# Patient Record
Sex: Female | Born: 1935 | Race: White | Hispanic: No
Health system: Southern US, Community
[De-identification: ages and names within clinical notes are randomized; demographics above are authoritative.]

## PROBLEM LIST (undated history)

## (undated) DIAGNOSIS — M1990 Unspecified osteoarthritis, unspecified site: Secondary | ICD-10-CM

## (undated) HISTORY — PX: HERNIA REPAIR: SHX51

---

## 2021-09-19 DEATH — deceased

## 2022-05-22 ENCOUNTER — Emergency Department (HOSPITAL_COMMUNITY): Payer: Medicare Other

## 2022-05-22 ENCOUNTER — Encounter (HOSPITAL_COMMUNITY): Payer: Self-pay

## 2022-05-22 ENCOUNTER — Other Ambulatory Visit: Payer: Self-pay

## 2022-05-22 ENCOUNTER — Emergency Department (HOSPITAL_COMMUNITY)
Admission: EM | Admit: 2022-05-22 | Discharge: 2022-05-22 | Disposition: A | Discharge status: Home / Self Care | Payer: Medicare Other | Attending: Emergency Medicine | Admitting: Emergency Medicine

## 2022-05-22 DIAGNOSIS — Z79899 Other long term (current) drug therapy: Secondary | ICD-10-CM | POA: Diagnosis not present

## 2022-05-22 DIAGNOSIS — I10 Essential (primary) hypertension: Secondary | ICD-10-CM | POA: Diagnosis not present

## 2022-05-22 DIAGNOSIS — Z7982 Long term (current) use of aspirin: Secondary | ICD-10-CM | POA: Insufficient documentation

## 2022-05-22 DIAGNOSIS — R0789 Other chest pain: Secondary | ICD-10-CM | POA: Diagnosis present

## 2022-05-22 HISTORY — DX: Unspecified osteoarthritis, unspecified site: M19.90

## 2022-05-22 LAB — BASIC METABOLIC PANEL
Anion gap: 9 (ref 5–15)
BUN: 59 mg/dL — ABNORMAL HIGH (ref 8–23)
CO2: 29 mmol/L (ref 22–32)
Calcium: 9.2 mg/dL (ref 8.9–10.3)
Chloride: 100 mmol/L (ref 98–111)
Creatinine, Ser: 1.23 mg/dL — ABNORMAL HIGH (ref 0.44–1.00)
GFR, Estimated: 43 mL/min — ABNORMAL LOW (ref >60)
Glucose, Bld: 95 mg/dL (ref 70–99)
Potassium: 4.6 mmol/L (ref 3.5–5.1)
Sodium: 138 mmol/L (ref 135–145)

## 2022-05-22 LAB — CBC
HCT: 32.9 % — ABNORMAL LOW (ref 36.0–46.0)
Hemoglobin: 10.2 g/dL — ABNORMAL LOW (ref 12.0–15.0)
MCH: 23.2 pg — ABNORMAL LOW (ref 26.0–34.0)
MCHC: 31 g/dL (ref 30.0–36.0)
MCV: 74.9 fL — ABNORMAL LOW (ref 80.0–100.0)
Platelets: 243 10*3/uL (ref 150–400)
RBC: 4.39 MIL/uL (ref 3.87–5.11)
RDW: 16.1 % — ABNORMAL HIGH (ref 11.5–15.5)
WBC: 8.3 10*3/uL (ref 4.0–10.5)
nRBC: 0 % (ref 0.0–0.2)

## 2022-05-22 LAB — TROPONIN I (HIGH SENSITIVITY): Troponin I (High Sensitivity): 10 ng/L (ref <18)

## 2022-05-22 MED ORDER — ACETAMINOPHEN 325 MG PO TABS
650.0000 mg | ORAL_TABLET | Freq: Once | ORAL | Status: AC
  Administered 2022-05-22: 650 mg via ORAL
  Filled 2022-05-22: qty 2

## 2022-05-22 MED ORDER — IBUPROFEN 800 MG PO TABS: ORAL_TABLET | ORAL | 0 refills | Status: AC

## 2022-05-22 NOTE — Discharge Instructions (Addendum)
Follow-up with your family doctor in 1 to 2 weeks if not improving 

## 2022-05-22 NOTE — ED Triage Notes (Signed)
Pt BIBA from [facility]. Pt c/o chest pain beginning yesterday. Chest pain increases with movement.   Pt also c/o pain in L knee- known problem- in process  Aox4  HR: 64 BP: 116/50 SPO2: 98 RA

## 2022-05-22 NOTE — ED Notes (Signed)
[facility] contacted. Pt on transpo list. ?

## 2022-05-22 NOTE — ED Provider Notes (Signed)
[facility] DEPT Provider Note   CSN: [id number] Arrival date & time: 05/22/22  1234     History  Chief Complaint  Patient presents with   Chest Pain    Ashley Maldonado is a 88 y.o. female.  Patient complains of chest discomfort when she leans forward.  She states that it started hurting when she tried to get off the commode.  Patient has a history of  The history is provided by the patient and medical records. No language interpreter was used.  Chest Pain Pain location:  L chest Pain quality: aching   Pain radiates to:  Does not radiate Pain severity:  Moderate Onset quality:  Sudden Timing:  Intermittent Progression:  Waxing and waning Chronicity:  New Context: not breathing   Relieved by:  Nothing Exacerbated by: Movement. Associated symptoms: no abdominal pain, no back pain, no cough, no fatigue and no headache       Home Medications Prior to Admission medications   Medication Sig Start Date End Date Taking? Authorizing Provider  acetaminophen (TYLENOL) 325 MG tablet Take 650 mg by mouth every 8 (eight) hours as needed (pain).   Yes [provider]  aspirin EC 81 MG tablet Take 81 mg by mouth daily. Swallow whole.   Yes [provider]  benazepril (LOTENSIN) 40 MG tablet Take 40 mg by mouth daily. 05/17/22  Yes [provider]  calcium carbonate (TUMS EX) 750 MG chewable tablet Chew 750 mg by mouth every 8 (eight) hours as needed (acid reflux).   Yes [provider]  Carboxymethylcellulose Sodium (REFRESH TEARS OP) Place 1 drop into both eyes every 8 (eight) hours as needed (dry eyes).   Yes [provider]  cetaphil (CETAPHIL) lotion Apply 1 application. topically daily.   Yes [provider]  diclofenac Sodium (VOLTAREN) 1 % GEL Apply 1 application. topically 3 (three) times daily. 05/12/22  Yes [provider]  FERROUS SULFATE PO Take 1 tablet by mouth daily. Iron High-Potency    Yes [provider]  fluticasone (FLONASE) 50 MCG/ACT nasal spray Place 1 spray into both nostrils 2 (two) times daily. 05/06/22  Yes [provider]  furosemide (LASIX) 20 MG tablet Take 20-40 mg by mouth See admin instructions. 40 mg at 09:00 20 mg at 13:00 05/17/22  Yes [provider]  hydrocortisone cream (PREPARATION H) 1 % Apply 1 application. topically daily as needed (Hemorrhoids).   Yes [provider]  ibuprofen (ADVIL) 800 MG tablet Take 1 every 8 hours for pain that is not relieved by Tylenol alone 05/22/22  Yes Milton Ferguson, MD  labetalol (NORMODYNE) 300 MG tablet Take 300 mg by mouth 2 (two) times daily. 05/17/22  Yes [provider]  levothyroxine (SYNTHROID) 88 MCG tablet Take 88 mcg by mouth daily. 05/17/22  Yes [provider]  loperamide (IMODIUM) 1 MG/5ML solution Take 3 mg by mouth daily as needed for diarrhea or loose stools.   Yes [provider]  loratadine (CLARITIN) 10 MG tablet Take 10 mg by mouth daily.   Yes [provider]  multivitamin-iron-minerals-folic acid (THERAPEUTIC-M) TABS tablet Take 1 tablet by mouth daily.   Yes [provider]  neomycin-bacitracin-polymyxin (NEOSPORIN) OINT Apply 1 application. topically daily as needed (nose bleeds).   Yes [provider]  nystatin powder Apply 1 application. topically every 12 (twelve) hours as needed (yeast, irritation, redness).   Yes [provider]  OVER THE COUNTER MEDICATION Take 600 mg by  mouth every 12 (twelve) hours as needed (cough, congestion).   Yes [provider]  predniSONE (DELTASONE) 10 MG tablet Take 10 mg by mouth at bedtime.   Yes [provider]  Simethicone 80 MG TABS Take 80 mg by mouth every 8 (eight) hours as needed (gas).   Yes [provider]  simvastatin (ZOCOR) 40 MG tablet Take 40 mg by mouth every evening. 05/17/22  Yes [provider]  tiZANidine (ZANAFLEX) 2 MG  tablet Take 2 mg by mouth every 8 (eight) hours as needed for muscle spasms.   Yes [provider]  traMADol (ULTRAM) 50 MG tablet Take 50 mg by mouth every 8 (eight) hours as needed for pain. 05/05/22  Yes [provider]  triamterene-hydrochlorothiazide (MAXZIDE) 75-50 MG tablet Take 1 tablet by mouth daily. 05/17/22  Yes [provider]      Allergies    Patient has no known allergies.    Review of Systems   Review of Systems  Constitutional:  Negative for appetite change and fatigue.  HENT:  Negative for congestion, ear discharge and sinus pressure.   Eyes:  Negative for discharge.  Respiratory:  Negative for cough.   Cardiovascular:  Positive for chest pain.  Gastrointestinal:  Negative for abdominal pain and diarrhea.  Genitourinary:  Negative for frequency and hematuria.  Musculoskeletal:  Negative for back pain.  Skin:  Negative for rash.  Neurological:  Negative for seizures and headaches.  Psychiatric/Behavioral:  Negative for hallucinations.    Physical Exam Updated Vital Signs BP (!) 138/58   Pulse 64   Temp 98.6 F (37 C) (Oral)   Resp 17   Ht 5\' 5"  (1.651 m)   Wt 117.9 kg   SpO2 97%   BMI 43.27 kg/m  Physical Exam Vitals and nursing note reviewed.  Constitutional:      Appearance: She is well-developed.  HENT:     Head: Normocephalic.     Nose: Nose normal.  Eyes:     General: No scleral icterus.    Conjunctiva/sclera: Conjunctivae normal.  Neck:     Thyroid: No thyromegaly.  Cardiovascular:     Rate and Rhythm: Normal rate and regular rhythm.     Heart sounds: No murmur heard.   No friction rub. No gallop.  Pulmonary:     Breath sounds: No stridor. No wheezing or rales.  Chest:     Chest wall: Tenderness present.  Abdominal:     General: There is no distension.     Tenderness: There is no abdominal tenderness. There is no rebound.  Musculoskeletal:        General: Normal range of motion.     Cervical back: Neck supple.   Lymphadenopathy:     Cervical: No cervical adenopathy.  Skin:    Findings: No erythema or rash.  Neurological:     Mental Status: She is alert and oriented to person, place, and time.     Motor: No abnormal muscle tone.     Coordination: Coordination normal.  Psychiatric:        Behavior: Behavior normal.    ED Results / Procedures / Treatments   Labs (all labs ordered are listed, but only abnormal results are displayed) Labs Reviewed  BASIC METABOLIC PANEL - Abnormal; Notable for the following components:      Result Value   BUN 59 (*)    Creatinine, Ser 1.23 (*)    GFR, Estimated 43 (*)    All other components within  normal limits  CBC - Abnormal; Notable for the following components:   Hemoglobin 10.2 (*)    HCT 32.9 (*)    MCV 74.9 (*)    MCH 23.2 (*)    RDW 16.1 (*)    All other components within normal limits  TROPONIN I (HIGH SENSITIVITY)  TROPONIN I (HIGH SENSITIVITY)    EKG EKG Interpretation  Date/Time:  Saturday May 22 2022 12:54:03 EDT Ventricular Rate:  62 PR Interval:  192 QRS Duration: 104 QT Interval:  408 QTC Calculation: 415 R Axis:   5 Text Interpretation: Sinus rhythm Inferior infarct, old Confirmed by Bethann BerkshireZammit, Lyden Redner [phone]([id number]) on 05/22/2022 2:46:56 PM  Radiology DG Chest 2 View  Result Date: 05/22/2022 CLINICAL DATA:  Chest pain beginning yesterday. EXAM: CHEST - 2 VIEW COMPARISON:  06/04/2021 FINDINGS: Stable mild cardiomegaly. Mild left basilar scarring is also stable. No evidence of acute infiltrate or edema. No evidence of pleural effusion. Anterior dislocations of both shoulders incidentally noted. IMPRESSION: Mild cardiomegaly and left basilar scarring. No active lung disease. Bilateral anterior shoulder dislocations. Electronically Signed   By: Danae OrleansJohn A Stahl M.D.   On: 05/22/2022 13:43    Procedures Procedures    Medications Ordered in ED Medications  acetaminophen (TYLENOL) tablet 650 mg (650 mg Oral Given 05/22/22 1333)    ED Course/  Medical Decision Making/ A&P                           Medical Decision Making Amount and/or Complexity of Data Reviewed Labs: ordered. Radiology: ordered.  Risk OTC drugs. Prescription drug management.  This patient presents to the ED for concern of chest pain, this involves an extensive number of treatment options, and is a complaint that carries with it a high risk of complications and morbidity.  The differential diagnosis includes MI PE musculoskeletal   Co morbidities that complicate the patient evaluation  Hypertension   Additional history obtained:  Additional history obtained from patient External records from outside source obtained and reviewed including hospital record   Lab Tests:  I Ordered, and personally interpreted labs.  The pertinent results include: Hemoglobin mildly low at 10.2 troponin normal   Imaging Studies ordered:  I ordered imaging studies including chest x-ray I independently visualized and interpreted imaging which showed unremarkable I agree with the radiologist interpretation   Cardiac Monitoring: / EKG:  The patient was maintained on a cardiac monitor.  I personally viewed and interpreted the cardiac monitored which showed an underlying rhythm of: Normal sinus rhythm   Consultations Obtained: No consult Problem List / ED Course / Critical interventions / Medication management  Chest pain I ordered medication including Tylenol for pain Reevaluation of the patient after these medicines showed that the patient improved I have reviewed the patients home medicines and have made adjustments as needed   Social Determinants of Health:  None   Test / Admission - Considered:  CT chest  Patient with chest wall pain.  Labs EKG is unremarkable she will be discharged home with Motrin        Final Clinical Impression(s) / ED Diagnoses Final diagnoses:  Chest wall pain    Rx / DC Orders ED Discharge Orders          Ordered     ibuprofen (ADVIL) 800 MG tablet        05/22/22 1447              Bethann BerkshireZammit, Kruze Atchley, MD 05/22/22  1701  

## 2023-04-04 IMAGING — CR DG CHEST 2V
2 series · 2 of 2 positions shown · non-contrast
Comparison: 06/04/2021

CLINICAL DATA: Chest pain beginning yesterday.

EXAM:
CHEST - 2 VIEW

[w chest lat]
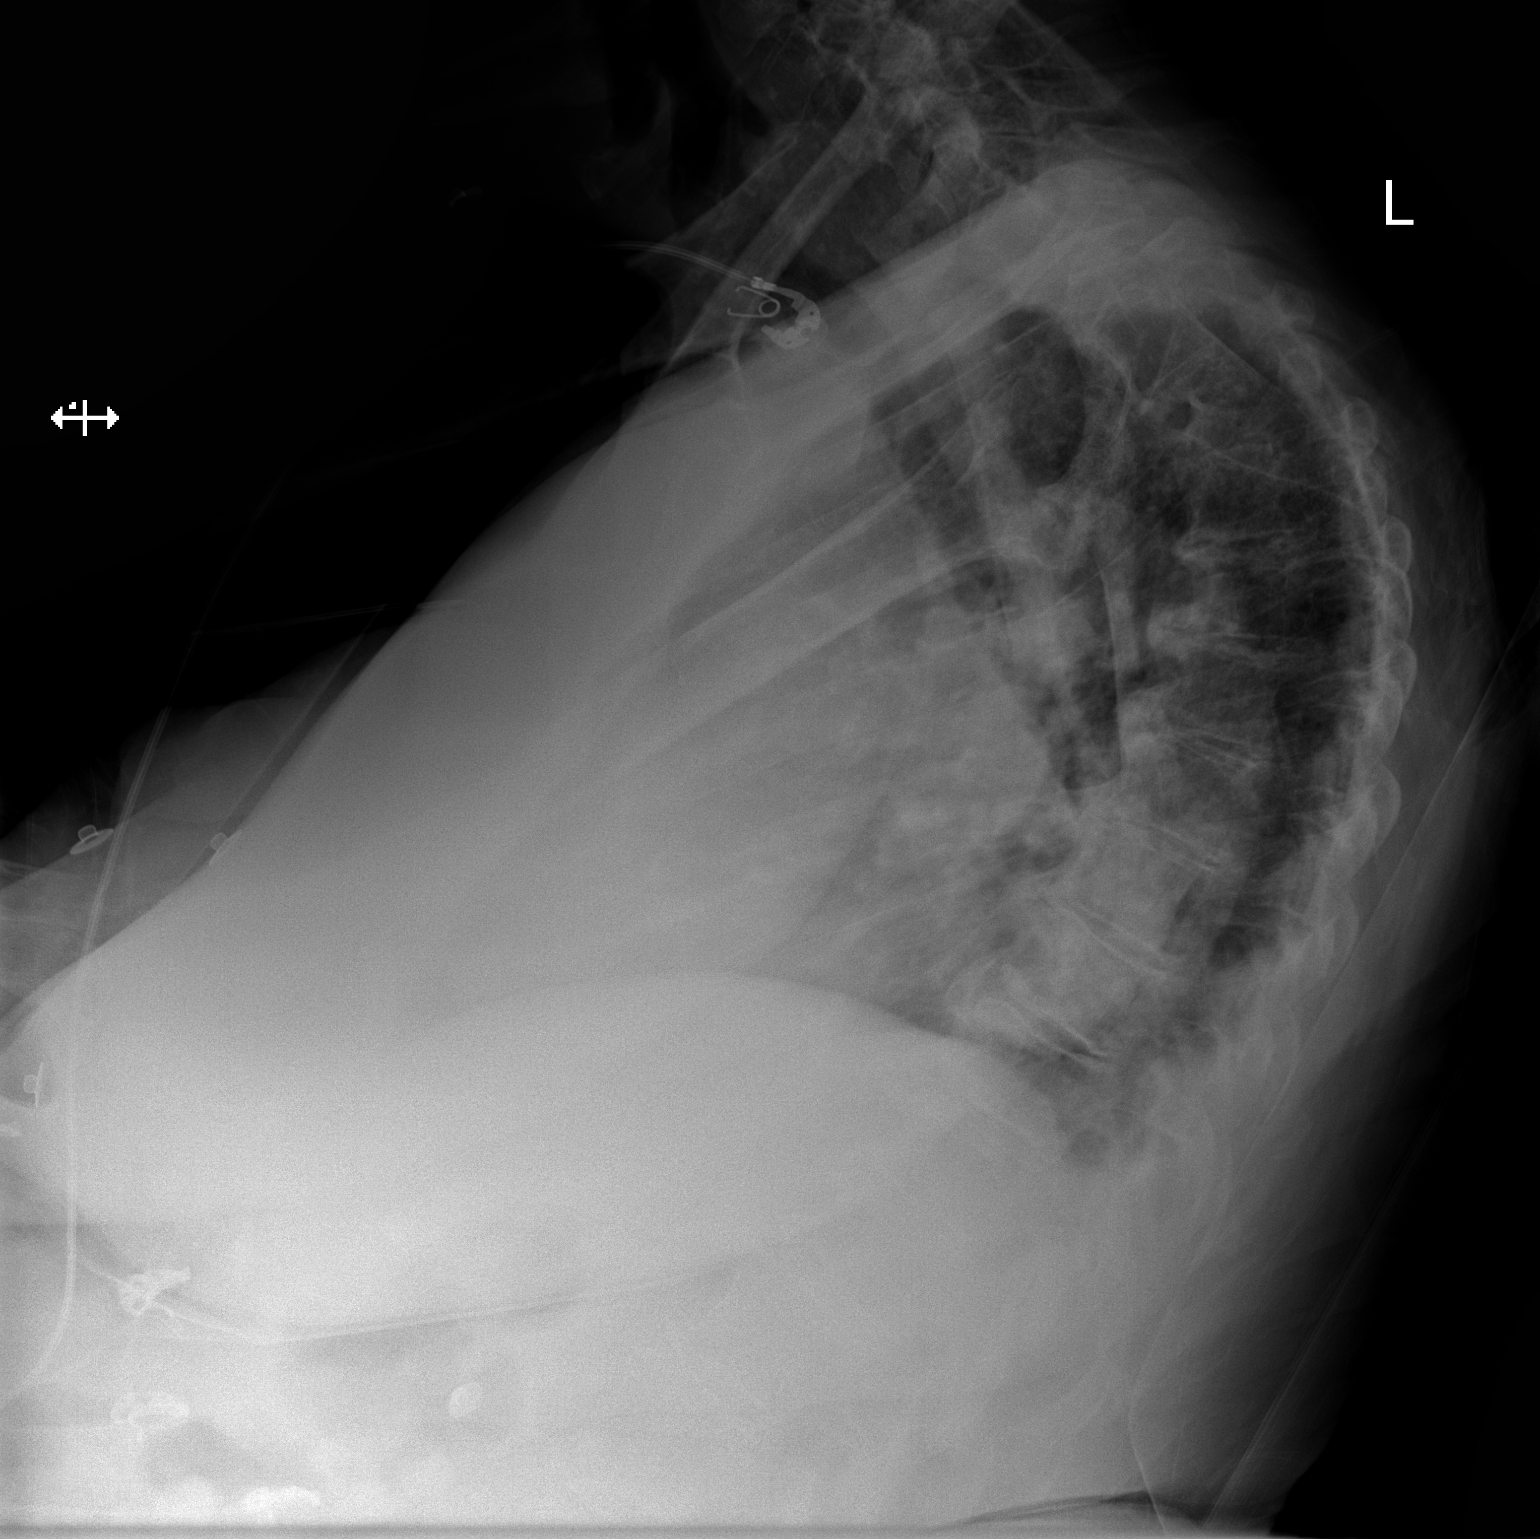

[x chest ap]
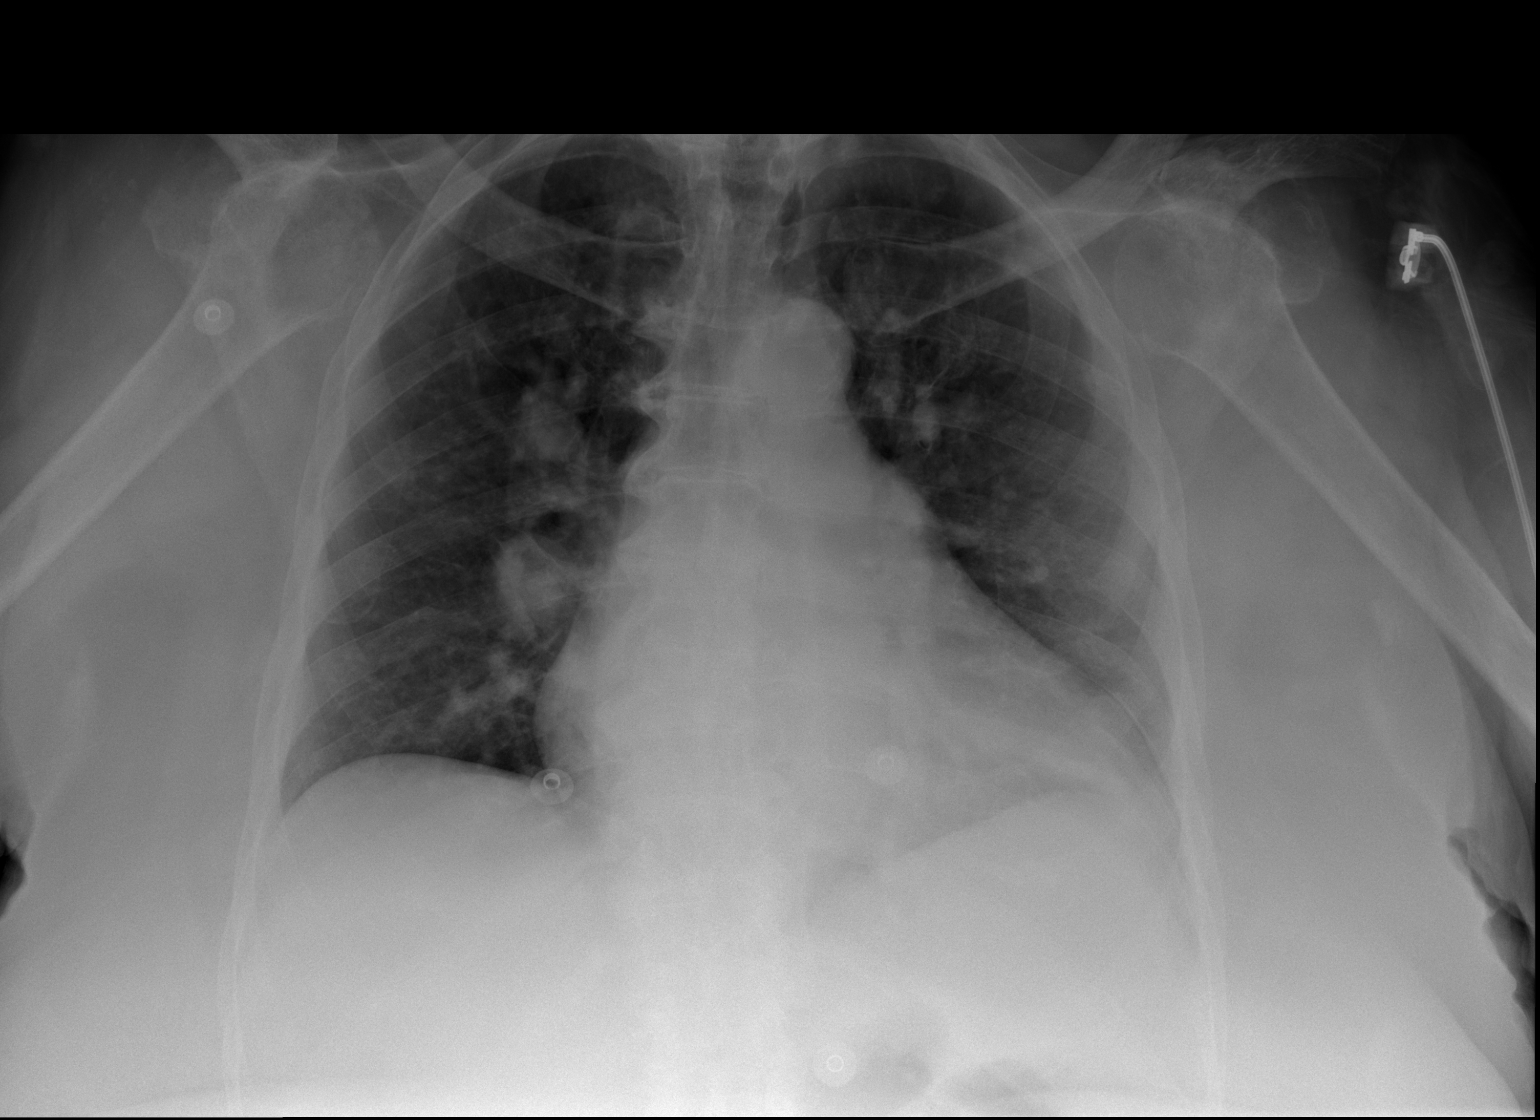

[2 of 2 positions shown; findings below may reference images not displayed]

FINDINGS: Stable mild cardiomegaly. Mild left basilar scarring is also stable.
No evidence of acute infiltrate or edema. No evidence of pleural
effusion.

Anterior dislocations of both shoulders incidentally noted.
IMPRESSION: Mild cardiomegaly and left basilar scarring. No active lung disease.

Bilateral anterior shoulder dislocations.

## 2023-05-21 DEATH — deceased
